# Patient Record
Sex: Female | Born: 1970 | Race: White | Hispanic: No | Marital: Married | Smoking: Former smoker
Health system: Southern US, Community
[De-identification: ages and names within clinical notes are randomized; demographics above are authoritative.]

## PROBLEM LIST (undated history)

## (undated) DIAGNOSIS — G43909 Migraine, unspecified, not intractable, without status migrainosus: Secondary | ICD-10-CM

## (undated) DIAGNOSIS — C801 Malignant (primary) neoplasm, unspecified: Secondary | ICD-10-CM

---

## 2021-03-13 ENCOUNTER — Ambulatory Visit
Admission: EM | Admit: 2021-03-13 | Discharge: 2021-03-13 | Disposition: A | Discharge status: Home / Self Care | Payer: BC Managed Care – PPO

## 2021-03-13 ENCOUNTER — Other Ambulatory Visit: Payer: Self-pay

## 2021-03-13 ENCOUNTER — Emergency Department
Admission: EM | Admit: 2021-03-13 | Discharge: 2021-03-13 | Disposition: A | Discharge status: Home / Self Care | Payer: BC Managed Care – PPO | Attending: Emergency Medicine | Admitting: Emergency Medicine

## 2021-03-13 ENCOUNTER — Encounter: Payer: Self-pay | Admitting: Emergency Medicine

## 2021-03-13 ENCOUNTER — Emergency Department: Payer: BC Managed Care – PPO

## 2021-03-13 DIAGNOSIS — R109 Unspecified abdominal pain: Secondary | ICD-10-CM | POA: Diagnosis not present

## 2021-03-13 DIAGNOSIS — Z87891 Personal history of nicotine dependence: Secondary | ICD-10-CM | POA: Diagnosis not present

## 2021-03-13 DIAGNOSIS — Z859 Personal history of malignant neoplasm, unspecified: Secondary | ICD-10-CM | POA: Diagnosis not present

## 2021-03-13 DIAGNOSIS — R112 Nausea with vomiting, unspecified: Secondary | ICD-10-CM | POA: Insufficient documentation

## 2021-03-13 HISTORY — DX: Malignant (primary) neoplasm, unspecified: C80.1

## 2021-03-13 HISTORY — DX: Migraine, unspecified, not intractable, without status migrainosus: G43.909

## 2021-03-13 LAB — URINALYSIS, COMPLETE (UACMP) WITH MICROSCOPIC
Bacteria, UA: NONE SEEN
Bilirubin Urine: NEGATIVE
Glucose, UA: NEGATIVE mg/dL
Ketones, ur: 20 mg/dL — AB
Leukocytes,Ua: NEGATIVE
Nitrite: NEGATIVE
Protein, ur: NEGATIVE mg/dL
Specific Gravity, Urine: 1.028 (ref 1.005–1.030)
pH: 5 (ref 5.0–8.0)

## 2021-03-13 LAB — COMPREHENSIVE METABOLIC PANEL
ALT: 14 U/L (ref 0–44)
AST: 19 U/L (ref 15–41)
Albumin: 4.2 g/dL (ref 3.5–5.0)
Alkaline Phosphatase: 36 U/L — ABNORMAL LOW (ref 38–126)
Anion gap: 8 (ref 5–15)
BUN: 17 mg/dL (ref 6–20)
CO2: 25 mmol/L (ref 22–32)
Calcium: 8.7 mg/dL — ABNORMAL LOW (ref 8.9–10.3)
Chloride: 107 mmol/L (ref 98–111)
Creatinine, Ser: 0.73 mg/dL (ref 0.44–1.00)
GFR, Estimated: >60 mL/min (ref >60)
Glucose, Bld: 120 mg/dL — ABNORMAL HIGH (ref 70–99)
Potassium: 3.9 mmol/L (ref 3.5–5.1)
Sodium: 140 mmol/L (ref 135–145)
Total Bilirubin: 1.3 mg/dL — ABNORMAL HIGH (ref 0.3–1.2)
Total Protein: 6.8 g/dL (ref 6.5–8.1)

## 2021-03-13 LAB — CBC
HCT: 45 % (ref 36.0–46.0)
Hemoglobin: 15.5 g/dL — ABNORMAL HIGH (ref 12.0–15.0)
MCH: 32.3 pg (ref 26.0–34.0)
MCHC: 34.4 g/dL (ref 30.0–36.0)
MCV: 93.8 fL (ref 80.0–100.0)
Platelets: 292 10*3/uL (ref 150–400)
RBC: 4.8 MIL/uL (ref 3.87–5.11)
RDW: 12.9 % (ref 11.5–15.5)
WBC: 7.3 10*3/uL (ref 4.0–10.5)
nRBC: 0 % (ref 0.0–0.2)

## 2021-03-13 LAB — LIPASE, BLOOD: Lipase: 30 U/L (ref 11–51)

## 2021-03-13 MED ORDER — ONDANSETRON 4 MG PO TBDP: 4.0000 mg | ORAL_TABLET | Freq: Three times a day (TID) | ORAL | 0 refills | Status: DC | PRN

## 2021-03-13 MED ORDER — DICYCLOMINE HCL 10 MG PO CAPS: 10.0000 mg | ORAL_CAPSULE | Freq: Three times a day (TID) | ORAL | 0 refills | Status: AC

## 2021-03-13 MED ORDER — METOCLOPRAMIDE HCL 5 MG/ML IJ SOLN
10.0000 mg | Freq: Once | INTRAMUSCULAR | Status: AC
  Administered 2021-03-13: 10 mg via INTRAVENOUS
  Filled 2021-03-13: qty 2

## 2021-03-13 MED ORDER — ONDANSETRON 4 MG PO TBDP: 4.0000 mg | ORAL_TABLET | Freq: Three times a day (TID) | ORAL | 0 refills | Status: AC | PRN

## 2021-03-13 MED ORDER — DICYCLOMINE HCL 10 MG PO CAPS
10.0000 mg | ORAL_CAPSULE | Freq: Once | ORAL | Status: AC
  Administered 2021-03-13: 10 mg via ORAL
  Filled 2021-03-13: qty 1

## 2021-03-13 MED ORDER — ONDANSETRON 4 MG PO TBDP
4.0000 mg | ORAL_TABLET | Freq: Once | ORAL | Status: AC
  Administered 2021-03-13: 4 mg via ORAL

## 2021-03-13 MED ORDER — SODIUM CHLORIDE 0.9 % IV SOLN
12.5000 mg | Freq: Once | INTRAVENOUS | Status: AC
  Administered 2021-03-13: 12.5 mg via INTRAVENOUS
  Filled 2021-03-13: qty 12.5

## 2021-03-13 MED ORDER — SODIUM CHLORIDE 0.9 % IV BOLUS
1000.0000 mL | Freq: Once | INTRAVENOUS | Status: AC
  Administered 2021-03-13: 1000 mL via INTRAVENOUS

## 2021-03-13 MED ORDER — PROMETHAZINE HCL 12.5 MG PO TABS: 12.5000 mg | ORAL_TABLET | Freq: Four times a day (QID) | ORAL | 0 refills | Status: AC | PRN

## 2021-03-13 MED ORDER — SODIUM CHLORIDE 0.9 % IV BOLUS
500.0000 mL | Freq: Once | INTRAVENOUS | Status: AC
  Administered 2021-03-13: 500 mL via INTRAVENOUS

## 2021-03-13 NOTE — ED Triage Notes (Signed)
Pt here for vomiting that started yesterday and was seen by UC but has not been getting relief from the meds that she was given. Pt states pain on the right flank side. Pt crying in triage.

## 2021-03-13 NOTE — ED Triage Notes (Signed)
Patient in office today stating that around 9:00 last night started vomiting. Some diarrhea but no nausea.  OTC: zofran left over 1 only  DEnies fever, nausea

## 2021-03-13 NOTE — Discharge Instructions (Addendum)
You can take Zofran up to every 6 hours. If you experience abdominal cramping, please take 10 mg of Bentyl. If you still have vomiting after taking Zofran, take Phenergan every 6 hours.

## 2021-03-13 NOTE — Discharge Instructions (Addendum)
Take the antinausea medication as directed.    Keep yourself hydrated with clear liquids, such as water, Gatorade, Pedialyte, Sprite, or ginger ale.    Go to the emergency department if you have acute worsening symptoms.    Follow up with your primary care provider if your symptoms are not improving.      

## 2021-03-13 NOTE — ED Provider Notes (Signed)
Roderic Palau    CSN: [id number] Arrival date & time: 03/13/21  1856      History   Chief Complaint Chief Complaint  Patient presents with  . Emesis    HPI [facility] is a 50 y.o. female.   Patient presents with nausea and vomiting since yesterday.  Last episode of emesis 7 AM.  She also reports mild diarrhea.  She treated her nausea and vomiting with 1 dose of Zofran that she had in her purse but she does not have anymore.  She denies fever, chills, cough, shortness of breath, abdominal pain, or other symptoms.  Her medical history includes migraine headaches and skin cancer.  Patient is from [place] and is here attending her son's graduation from [employer].     The history is provided by the patient.    Past Medical History:  Diagnosis Date  . Cancer ([place])   . Migraine     There are no problems to display for this patient.   History reviewed. No pertinent surgical history.  OB History   No obstetric history on file.      Home Medications    Prior to Admission medications   Medication Sig Start Date End Date Taking? Authorizing Provider  levothyroxine (SYNTHROID) 50 MCG tablet  05/04/18  Yes [provider]  ondansetron (ZOFRAN ODT) 4 MG disintegrating tablet Take 1 tablet (4 mg total) by mouth every 8 (eight) hours as needed for nausea or vomiting. 03/13/21  Yes Sharion Balloon, NP  propranolol (INDERAL) 10 MG tablet  11/06/20  Yes [provider]  SUMAtriptan (IMITREX) 100 MG tablet take 1 tablet (100 mg) by oral route PRN with 2 aleve at onset of migraine 02/20/21  Yes [provider]  Botulinum Toxin Type A, Cosm, 100 units SOLR Inject into the skin.    [provider]  LORazepam (ATIVAN) 0.5 MG tablet Take 0.25-0.5 mg by mouth every 6 (six) hours as needed. 01/07/21   [provider]  metroNIDAZOLE (METROGEL) 0.75 % gel SMARTSIG:Sparingly Topical 1 to 2 Times Daily 02/02/21   [provider]  traZODone  (DESYREL) 100 MG tablet Take 100-150 mg by mouth at bedtime as needed. 03/01/21   [provider]    Family History No family history on file.  Social History Social History   Tobacco Use  . Smoking status: Former Research scientist (life sciences)  . Smokeless tobacco: Former Network engineer Use Topics  . Alcohol use: Yes     Allergies   Penicillins   Review of Systems Review of Systems  Constitutional: Negative for chills and fever.  Eyes: Negative for pain and visual disturbance.  Respiratory: Negative for cough and shortness of breath.   Cardiovascular: Negative for chest pain and palpitations.  Gastrointestinal: Positive for diarrhea, nausea and vomiting. Negative for abdominal pain.  Genitourinary: Negative for dysuria and hematuria.  Skin: Negative for color change and rash.  All other systems reviewed and are negative.    Physical Exam Triage Vital Signs ED Triage Vitals  Enc Vitals Group     BP      Pulse      Resp      Temp      Temp src      SpO2      Weight      Height      Head Circumference      Peak Flow      Pain Score      Pain Loc  Pain Edu?      Excl. in [place]?    No data found.  Updated Vital Signs BP 105/70 (BP Location: Left Arm)   Pulse 75   Temp 99.1 F (37.3 C) (Oral)   Resp 20   Ht 5\' 2"  (1.575 m)   Wt 133 lb (60.3 kg)   LMP  (LMP Unknown)   SpO2 97%   BMI 24.33 kg/m   Visual Acuity Right Eye Distance:   Left Eye Distance:   Bilateral Distance:    Right Eye Near:   Left Eye Near:    Bilateral Near:     Physical Exam Vitals and nursing note reviewed.  Constitutional:      General: She is not in acute distress.    Appearance: She is well-developed.  HENT:     Head: Normocephalic and atraumatic.     Mouth/Throat:     Mouth: Mucous membranes are moist.  Eyes:     Conjunctiva/sclera: Conjunctivae normal.  Cardiovascular:     Rate and Rhythm: Normal rate and regular rhythm.     Heart sounds: Normal heart sounds.  Pulmonary:      Effort: Pulmonary effort is normal. No respiratory distress.     Breath sounds: Normal breath sounds.  Abdominal:     General: Bowel sounds are normal. There is no distension.     Palpations: Abdomen is soft.     Tenderness: There is no abdominal tenderness. There is no guarding or rebound.  Musculoskeletal:     Cervical back: Neck supple.  Skin:    General: Skin is warm and dry.  Neurological:     General: No focal deficit present.     Mental Status: She is alert and oriented to person, place, and time.     Gait: Gait normal.  Psychiatric:        Mood and Affect: Mood normal.        Behavior: Behavior normal.      UC Treatments / Results  Labs (all labs ordered are listed, but only abnormal results are displayed) Labs Reviewed - No data to display  EKG   Radiology No results found.  Procedures Procedures (including critical care time)  Medications Ordered in UC Medications  ondansetron (ZOFRAN-ODT) disintegrating tablet 4 mg (4 mg Oral Given 03/13/21 0912)    Initial Impression / Assessment and Plan / UC Course  I have reviewed the triage vital signs and the nursing notes.  Pertinent labs & imaging results that were available during my care of the patient were reviewed by me and considered in my medical decision making (see chart for details).    Nausea and vomiting.  Zofran given here and prescription for additional provided.  Instructed patient to keep her self hydrated with clear liquids such as water or Gatorade.  ED precautions discussed.  Instructed her to follow-up with her PCP when she returns home to [place] next week if her symptoms are not improving.  Discussed that she should consider having a COVID test; she declines having one here.  She agrees to plan of care.   Final Clinical Impressions(s) / UC Diagnoses   Final diagnoses:  Non-intractable vomiting with nausea, unspecified vomiting type     Discharge Instructions     Take the antinausea  medication as directed.    Keep yourself hydrated with clear liquids, such as water, Gatorade, Pedialyte, Sprite, or ginger ale.    Go to the emergency department if you have acute worsening symptoms.  Follow up with your primary care provider if your symptoms are not improving.         ED Prescriptions    Medication Sig Dispense Auth. Provider   ondansetron (ZOFRAN ODT) 4 MG disintegrating tablet Take 1 tablet (4 mg total) by mouth every 8 (eight) hours as needed for nausea or vomiting. 20 tablet Sharion Balloon, NP     PDMP not reviewed this encounter.   Sharion Balloon, NP 03/13/21 [phone]

## 2021-03-13 NOTE — ED Provider Notes (Signed)
  Physical Exam  BP (!) 130/95 (BP Location: Left Arm)   Pulse 87   Temp 98.8 F (37.1 C) (Oral)   Resp 18   Ht 5\' 2"  (1.575 m)   Wt 60.3 kg   LMP  (LMP Unknown)   SpO2 99%   BMI 24.33 kg/m   Physical Exam  ED Course/Procedures   Clinical Course as of 03/13/21 2305  Fri Mar 13, 2021  1754 Glucose(!): 120 [JW]    Clinical Course User Index [JW] Lannie Fields, PA-C    Procedures  MDM   Assumed patient care for Rollene Fare, NP.  Patient CT renal stone study returned without evidence of nephrolithiasis.  Patient was given Reglan and Bentyl and she reported that her abdominal discomfort improved.  CBC and CMP were reassuring.  Patient was discharged with Zofran, Phenergan and Bentyl.     Vallarie Mare [place], PA-C 03/13/21 2306    Nance Pear, MD 03/13/21 2312

## 2021-03-13 NOTE — ED Provider Notes (Signed)
[facility] Provider Note ____________________________________________   Event Date/Time   First MD Initiated Contact with Patient 03/13/21 1511     (approximate)  I have reviewed the triage vital signs and the nursing notes.   HISTORY  Chief Complaint Emesis  HPI Anna Payne is a 50 y.o. female with history of migraine and cancer presents to the emergency department for treatment and evaluation of sudden onset nausea and vomiting with right flank pain about an hour after eating at a [employer] last night.  She states that she did have 1 sublingual Zofran but did not have any more.  She did get much relief from that single tablet.  She denies specific abdominal pain.  She has had no fever.  She has had no diarrhea.          Past Medical History:  Diagnosis Date  . Cancer ([place])   . Migraine     There are no problems to display for this patient.   No past surgical history on file.  Prior to Admission medications   Medication Sig Start Date End Date Taking? Authorizing Provider  dicyclomine (BENTYL) 10 MG capsule Take 1 capsule (10 mg total) by mouth 4 (four) times daily -  before meals and at bedtime for 5 days. 03/13/21 03/18/21 Yes Vallarie Mare M, PA-C  ondansetron (ZOFRAN ODT) 4 MG disintegrating tablet Take 1 tablet (4 mg total) by mouth every 8 (eight) hours as needed for up to 5 days. 03/13/21 03/18/21 Yes Vallarie Mare M, PA-C  promethazine (PHENERGAN) 12.5 MG tablet Take 1 tablet (12.5 mg total) by mouth every 6 (six) hours as needed for up to 3 days for nausea or vomiting. 03/13/21 03/16/21 Yes Vallarie Mare M, PA-C  Botulinum Toxin Type A, Cosm, 100 units SOLR Inject into the skin.    [provider]  levothyroxine (SYNTHROID) 50 MCG tablet  05/04/18   [provider]  LORazepam (ATIVAN) 0.5 MG tablet Take 0.25-0.5 mg by mouth every 6 (six) hours as needed. 01/07/21   [provider]   metroNIDAZOLE (METROGEL) 0.75 % gel SMARTSIG:Sparingly Topical 1 to 2 Times Daily 02/02/21   [provider]  propranolol (INDERAL) 10 MG tablet  11/06/20   [provider]  SUMAtriptan (IMITREX) 100 MG tablet take 1 tablet (100 mg) by oral route PRN with 2 aleve at onset of migraine 02/20/21   [provider]  traZODone (DESYREL) 100 MG tablet Take 100-150 mg by mouth at bedtime as needed. 03/01/21   [provider]    Allergies Penicillins  No family history on file.  Social History Social History   Tobacco Use  . Smoking status: Former Research scientist (life sciences)  . Smokeless tobacco: Former Network engineer Use Topics  . Alcohol use: Yes    Review of Systems  Constitutional: No fever/chills Eyes: No visual changes. ENT: No sore throat. Cardiovascular: Denies chest pain. Respiratory: Denies shortness of breath. Gastrointestinal: No abdominal pain.  Positive for nausea and vomiting no diarrhea.  No constipation. Genitourinary: Negative for dysuria. Musculoskeletal: Positive for right flank pain Skin: Negative for rash. Neurological: Negative for headaches, focal weakness or numbness. ____________________________________________   PHYSICAL EXAM:  VITAL SIGNS: ED Triage Vitals  Enc Vitals Group     BP 03/13/21 1422 (!) 130/95     Pulse Rate 03/13/21 1422 87     Resp 03/13/21 1422 18     Temp 03/13/21 1422 98.8 F (37.1 C)  Temp Source 03/13/21 1422 Oral     SpO2 03/13/21 1422 99 %     Weight 03/13/21 1421 133 lb (60.3 kg)     Height 03/13/21 1421 5\' 2"  (1.575 m)     Head Circumference --      Peak Flow --      Pain Score 03/13/21 1421 10     Pain Loc --      Pain Edu? --      Excl. in [place]? --     Constitutional: Alert and oriented. Well appearing and in no acute distress. Eyes: Conjunctivae are normal. Head: Atraumatic. Nose: No congestion/rhinnorhea. Mouth/Throat: Mucous membranes are moist.  Oropharynx non-erythematous. Neck: No stridor.    Hematological/Lymphatic/Immunilogical: No cervical lymphadenopathy. Cardiovascular: Normal rate, regular rhythm. Grossly normal heart sounds.  Good peripheral circulation. Respiratory: Normal respiratory effort.  No retractions. Lungs CTAB. Gastrointestinal: Soft and nontender. No distention. No abdominal bruits. No CVA tenderness. Genitourinary:  Musculoskeletal: No lower extremity tenderness nor edema.  No joint effusions. Neurologic:  Normal speech and language. No gross focal neurologic deficits are appreciated. No gait instability. Skin:  Skin is warm, dry and intact. No rash noted. Psychiatric: Mood and affect are normal. Speech and behavior are normal.  ____________________________________________   LABS (all labs ordered are listed, but only abnormal results are displayed)  Labs Reviewed  COMPREHENSIVE METABOLIC PANEL - Abnormal; Notable for the following components:      Result Value   Glucose, Bld 120 (*)    Calcium 8.7 (*)    Alkaline Phosphatase 36 (*)    Total Bilirubin 1.3 (*)    All other components within normal limits  CBC - Abnormal; Notable for the following components:   Hemoglobin 15.5 (*)    All other components within normal limits  URINALYSIS, COMPLETE (UACMP) WITH MICROSCOPIC - Abnormal; Notable for the following components:   Color, Urine YELLOW (*)    APPearance HAZY (*)    Hgb urine dipstick MODERATE (*)    Ketones, ur 20 (*)    All other components within normal limits  LIPASE, BLOOD   ____________________________________________  EKG  Not indicated ____________________________________________  RADIOLOGY  ED MD interpretation:   Pending I, Sherrie George, personally viewed and evaluated these images (plain radiographs) as part of my medical decision making, as well as reviewing the written report by the radiologist.  Official radiology report(s): CT Renal Stone Study  Result Date: 03/13/2021 CLINICAL DATA:  Flank pain with nausea and  vomiting EXAM: CT ABDOMEN AND PELVIS WITHOUT CONTRAST TECHNIQUE: Multidetector CT imaging of the abdomen and pelvis was performed following the standard protocol without IV contrast. COMPARISON:  None. FINDINGS: Lower chest: No acute abnormality. Hepatobiliary: Scattered hypodensities are noted throughout the liver consistent with tiny cysts measuring less than 1 cm. The gallbladder is within normal limits. Pancreas: Unremarkable. No pancreatic ductal dilatation or surrounding inflammatory changes. Spleen: Normal in size without focal abnormality. Adrenals/Urinary Tract: Adrenal glands are within normal limits. Kidneys are well visualized bilaterally. No obstructive changes are seen. Bladder is well distended. Stomach/Bowel: Scattered fecal material is noted throughout the colon without obstructive change. No significant constipation is identified. The appendix is well visualized and within normal limits. Small bowel and stomach are within normal limits. Fluid is noted throughout the stomach. Vascular/Lymphatic: No significant vascular findings are present. No enlarged abdominal or pelvic lymph nodes. Reproductive: Uterus and bilateral adnexa are unremarkable. IUD is noted in place. Other: No abdominal wall hernia or abnormality. No abdominopelvic ascites. Musculoskeletal:  No acute or significant osseous findings. IMPRESSION: Mild retained fecal material without obstructive change. No acute abnormality is noted. Electronically Signed   By: Inez Catalina M.D.   On: 03/13/2021 17:38    ____________________________________________   PROCEDURES  Procedure(s) performed (including Critical Care):  Procedures  ____________________________________________   INITIAL IMPRESSION / ASSESSMENT AND PLAN     50 year old female presenting to the emergency department for treatment and evaluation of sudden onset vomiting with right-sided flank pain.  See HPI for further details.  Plan will be to get labs and  urinalysis.  If hemoglobin or red cells noted in urinalysis, plan will be to order a CT rule out stone disease.  We will also order IV fluids and nausea medications.  DIFFERENTIAL DIAGNOSIS  Renal colic, gastroenteritis, food poisoning  ED COURSE  Work-up pending.  Patient care will be transitioned to [facility], [place].  Clinical Course as of 03/13/21 2003  Fri Mar 13, 2021  1754 Glucose(!): 120 [JW]    Clinical Course User Index [JW] Lannie Fields, PA-C   ___________________________________________   FINAL CLINICAL IMPRESSION(S) / ED DIAGNOSES  Final diagnoses:  Non-intractable vomiting with nausea, unspecified vomiting type     ED Discharge Orders         Ordered    dicyclomine (BENTYL) 10 MG capsule  3 times daily before meals & bedtime        03/13/21 1833    ondansetron (ZOFRAN ODT) 4 MG disintegrating tablet  Every 8 hours PRN        03/13/21 1833    promethazine (PHENERGAN) 12.5 MG tablet  Every 6 hours PRN        03/13/21 1833           Kaydynce Pat was evaluated in Emergency Department on 03/13/2021 for the symptoms described in the history of present illness. She was evaluated in the context of the global COVID-19 pandemic, which necessitated consideration that the patient might be at risk for infection with the SARS-CoV-2 virus that causes COVID-19. Institutional protocols and algorithms that pertain to the evaluation of patients at risk for COVID-19 are in a state of rapid change based on information released by regulatory bodies including the CDC and federal and state organizations. These policies and algorithms were followed during the patient's care in the ED.   Note:  This document was prepared using Dragon voice recognition software and may include unintentional dictation errors.   Victorino Dike, FNP 03/14/21 1940    Nance Pear, MD 03/15/21 [phone]

## 2022-06-20 IMAGING — CT CT RENAL STONE PROTOCOL
2 of 4 series · 17 of 46 positions shown, 19 images · non-contrast
Comparison: None.

CLINICAL DATA: Flank pain with nausea and vomiting

EXAM:
CT ABDOMEN AND PELVIS WITHOUT CONTRAST
TECHNIQUE: Multidetector CT imaging of the abdomen and pelvis was performed
following the standard protocol without IV contrast.

[Series 2: stone full standard · axial · 0.76mm/px · z∈[-464,-89]mm · 14 of 83 slices shown, 16 images]
[im 4/83  soft-tissue]
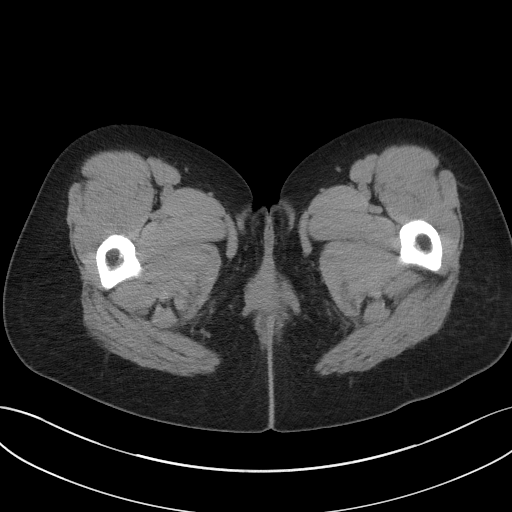
[im 4/83  bone]
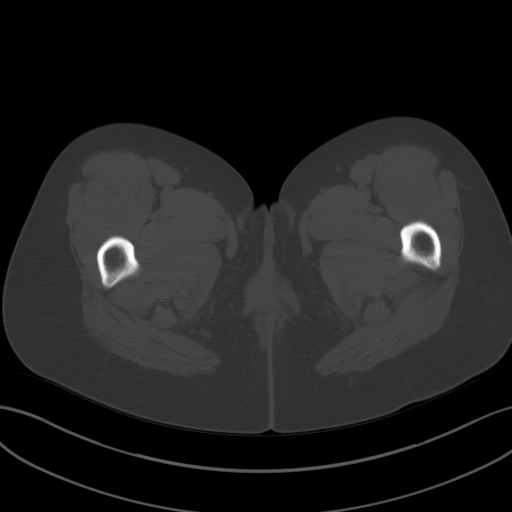
[im 10/83  soft-tissue]
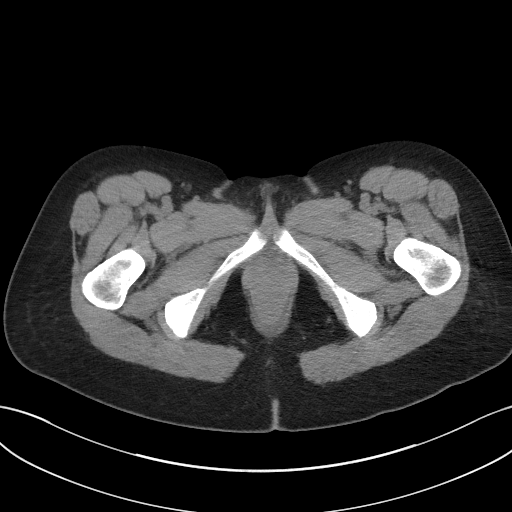
[im 17/83  soft-tissue]
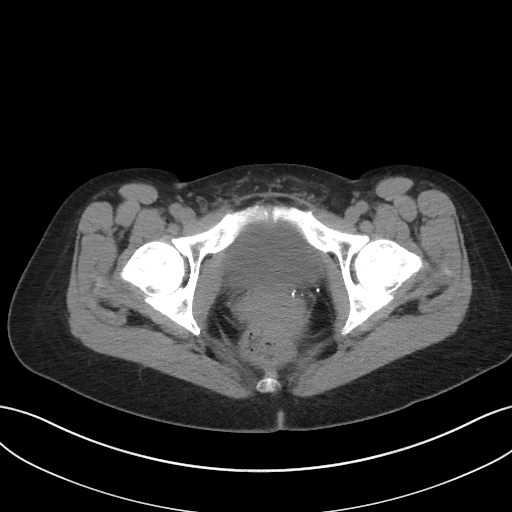
[im 23/83  soft-tissue]
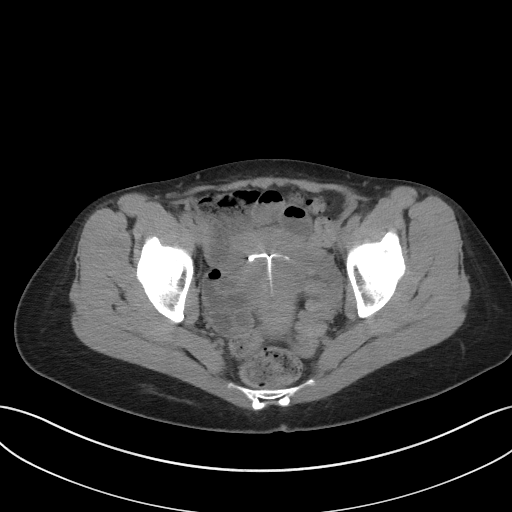
[im 27/83  soft-tissue]
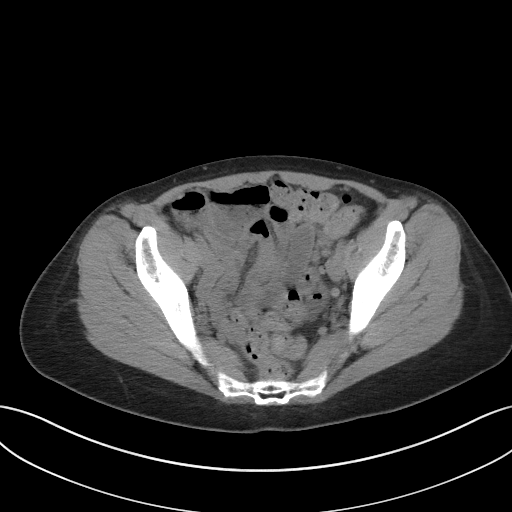
[im 33/83  soft-tissue]
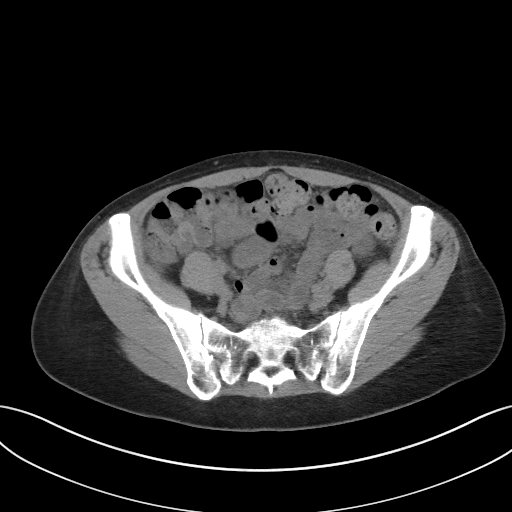
[im 40/83  soft-tissue]
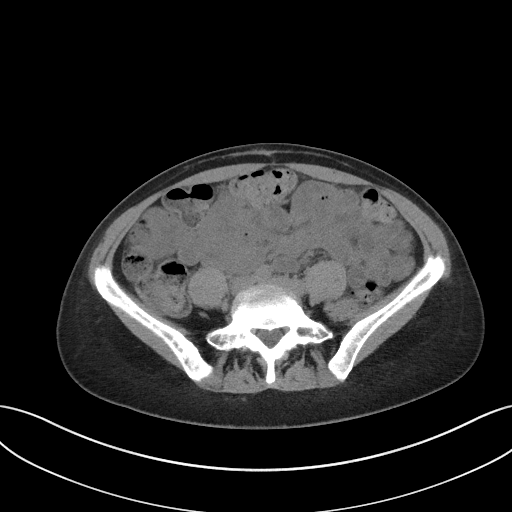
[im 43/83  soft-tissue]
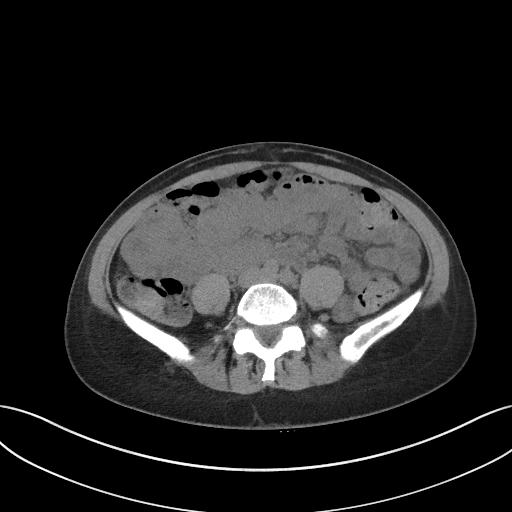
[im 50/83  soft-tissue]
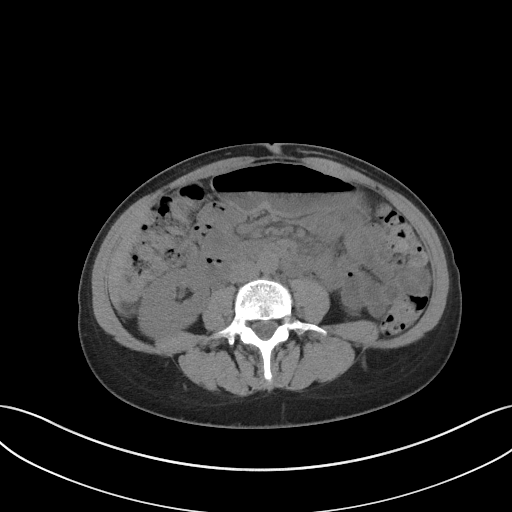
[im 50/83  bone]
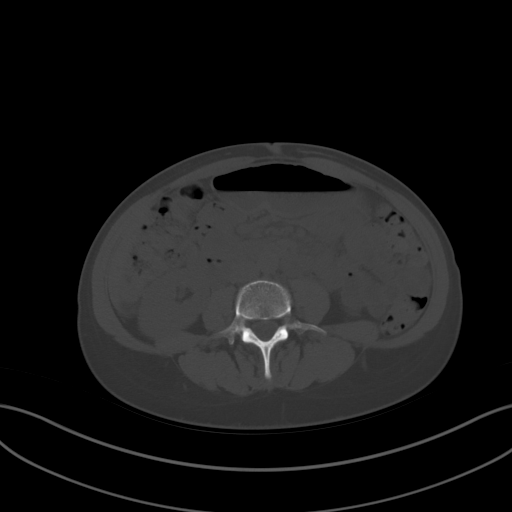
[im 56/83  soft-tissue]
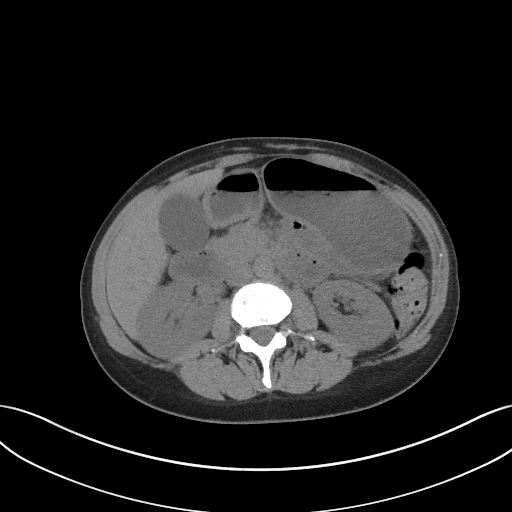
[im 63/83  soft-tissue]
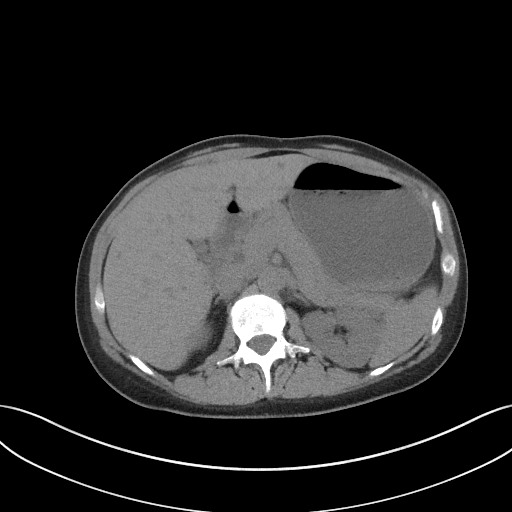
[im 66/83  soft-tissue]
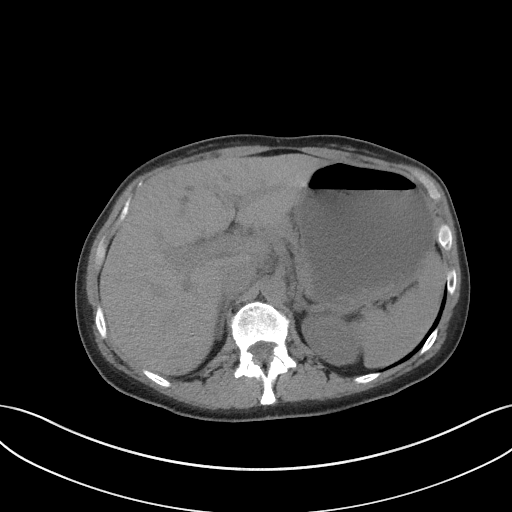
[im 73/83  soft-tissue]
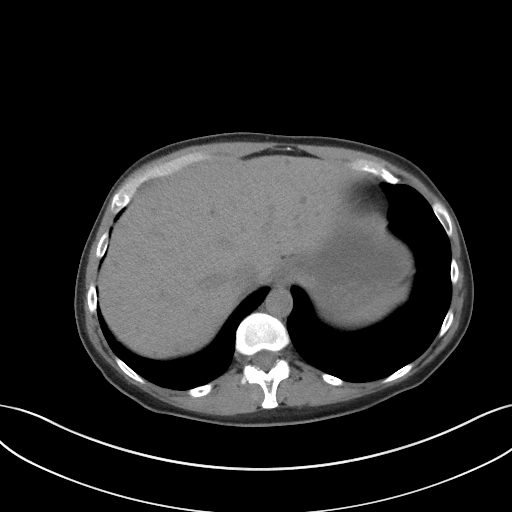
[im 79/83  soft-tissue]
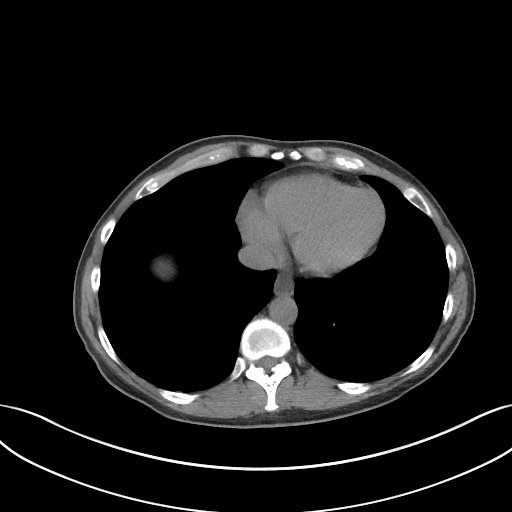

[Series 5: coronal · coronal · 0.72mm/px · 3 of 126 slices shown]
[im 42/126  soft-tissue]
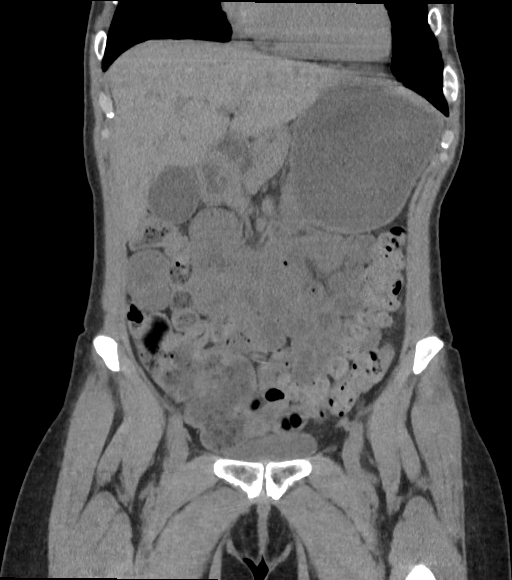
[im 56/126  soft-tissue]
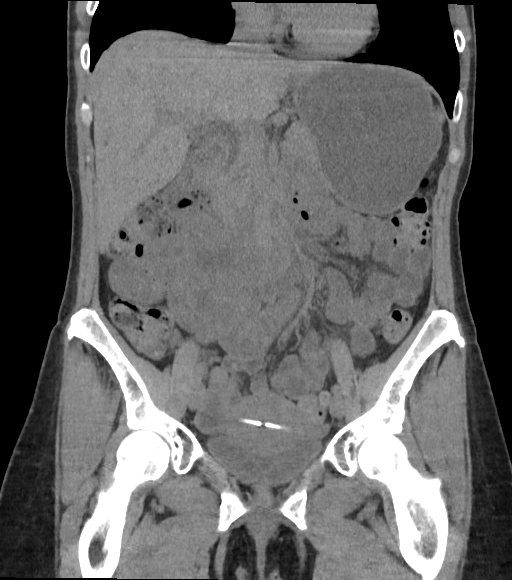
[im 70/126  soft-tissue]
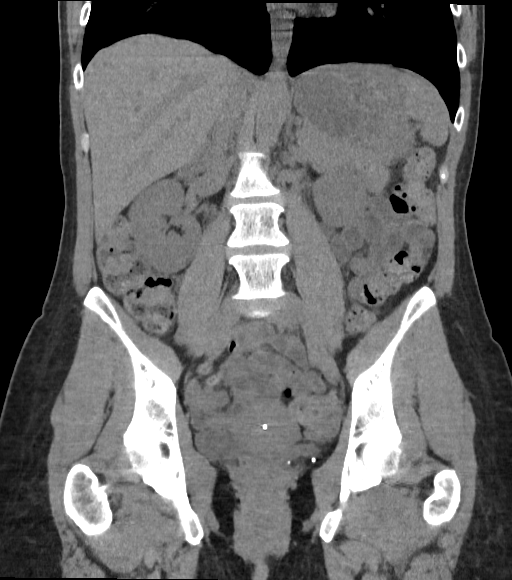

[17 of 46 positions shown; findings below may reference images not displayed]

FINDINGS: Lower chest: No acute abnormality.

Hepatobiliary: Scattered hypodensities are noted throughout the
liver consistent with tiny cysts measuring less than 1 cm. The
gallbladder is within normal limits.

Pancreas: Unremarkable. No pancreatic ductal dilatation or
surrounding inflammatory changes.

Spleen: Normal in size without focal abnormality.

Adrenals/Urinary Tract: Adrenal glands are within normal limits.
Kidneys are well visualized bilaterally. No obstructive changes are
seen. Bladder is well distended.

Stomach/Bowel: Scattered fecal material is noted throughout the
colon without obstructive change. No significant constipation is
identified. The appendix is well visualized and within normal
limits. Small bowel and stomach are within normal limits. Fluid is
noted throughout the stomach.

Vascular/Lymphatic: No significant vascular findings are present. No
enlarged abdominal or pelvic lymph nodes.

Reproductive: Uterus and bilateral adnexa are unremarkable. IUD is
noted in place.

Other: No abdominal wall hernia or abnormality. No abdominopelvic
ascites.

Musculoskeletal: No acute or significant osseous findings.
IMPRESSION: Mild retained fecal material without obstructive change.

No acute abnormality is noted.
